# Patient Record
Sex: Female | Born: 1935
Health system: Southern US, Community
[De-identification: ages and names within clinical notes are randomized; demographics above are authoritative.]

---

## 2006-06-09 ENCOUNTER — Ambulatory Visit: Payer: Self-pay | Admitting: Family Medicine

## 2006-07-06 ENCOUNTER — Ambulatory Visit: Payer: Self-pay | Admitting: Family Medicine

## 2007-06-08 ENCOUNTER — Ambulatory Visit: Payer: Self-pay | Admitting: Family Medicine

## 2007-09-18 ENCOUNTER — Ambulatory Visit: Payer: Self-pay | Admitting: General Practice

## 2007-09-18 ENCOUNTER — Other Ambulatory Visit: Payer: Self-pay

## 2007-10-02 ENCOUNTER — Inpatient Hospital Stay: Payer: Self-pay | Admitting: General Practice

## 2007-12-28 ENCOUNTER — Ambulatory Visit: Payer: Self-pay | Admitting: General Practice

## 2008-01-10 ENCOUNTER — Inpatient Hospital Stay: Payer: Self-pay | Admitting: General Practice

## 2009-01-31 IMAGING — CR DG KNEE 1-2V*L*
1 series · 2 of 2 positions shown · non-contrast
Comparison: none

REASON FOR EXAM: postop
COMMENTS:   Bedside (portable):Y

[Series 1: view not recorded · 0.17mm/px · 2 of 2 slices shown]
[im 1/2]
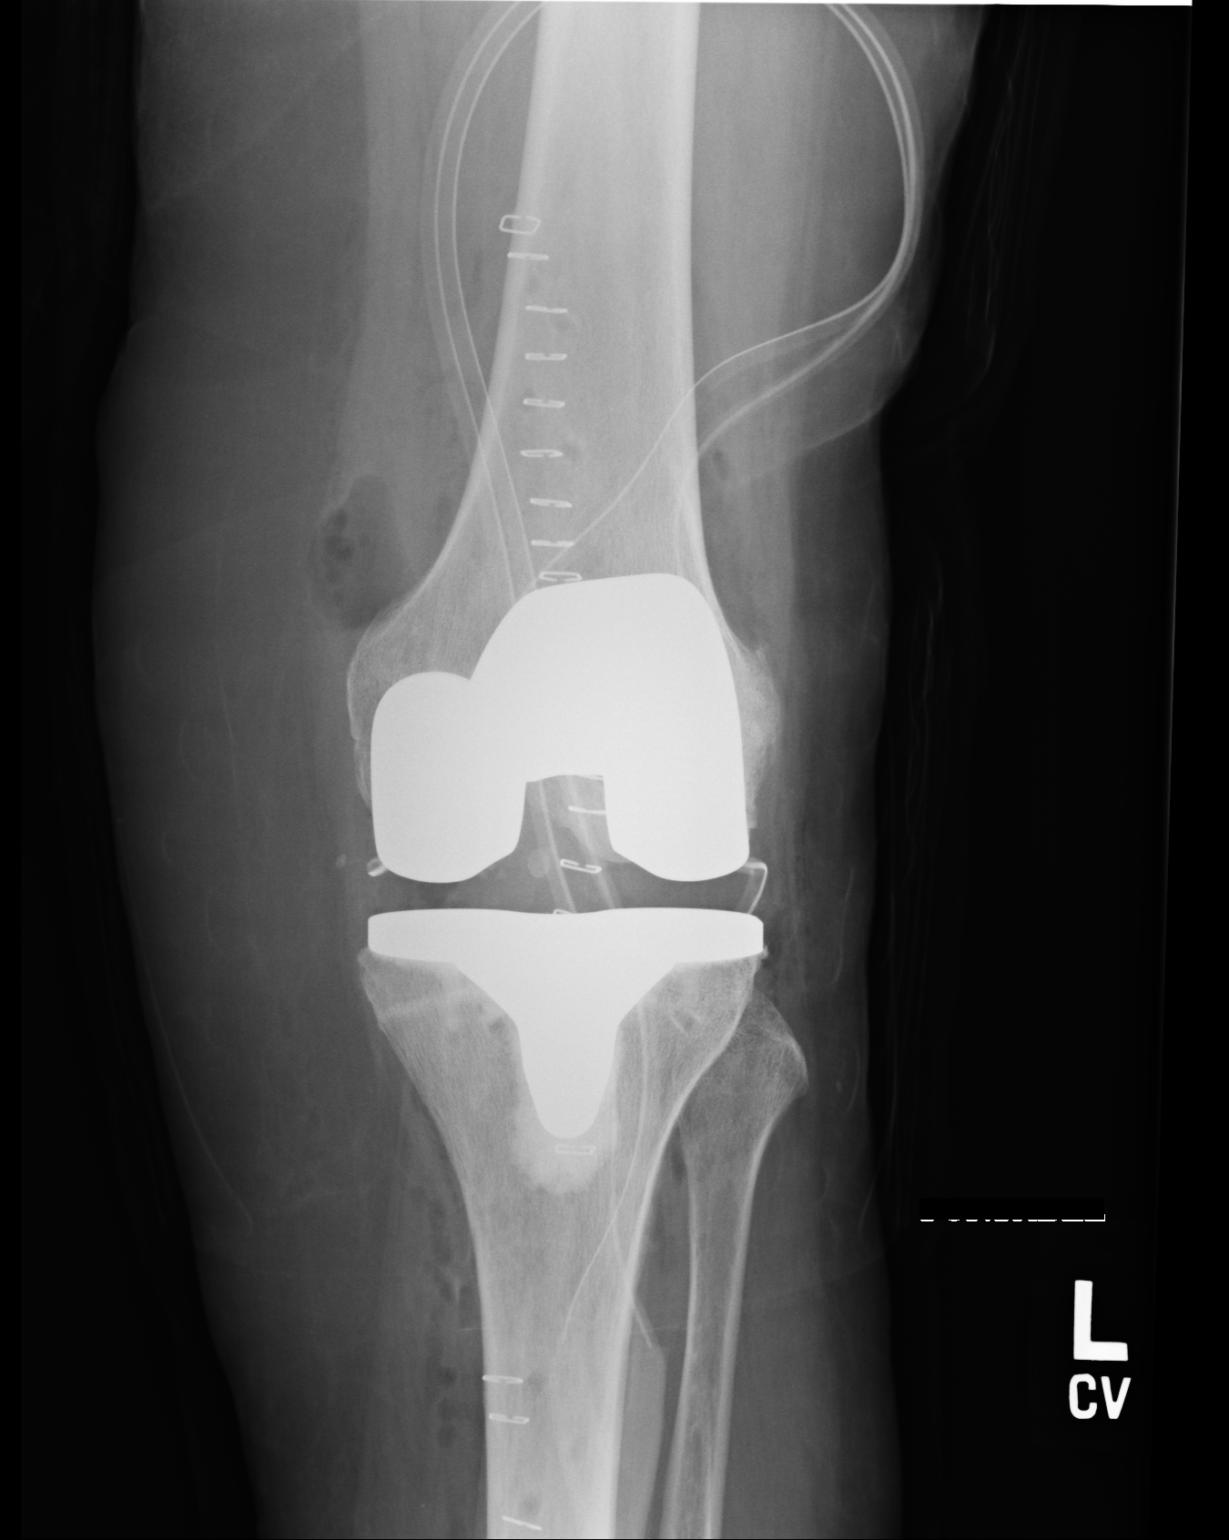
[im 2/2]
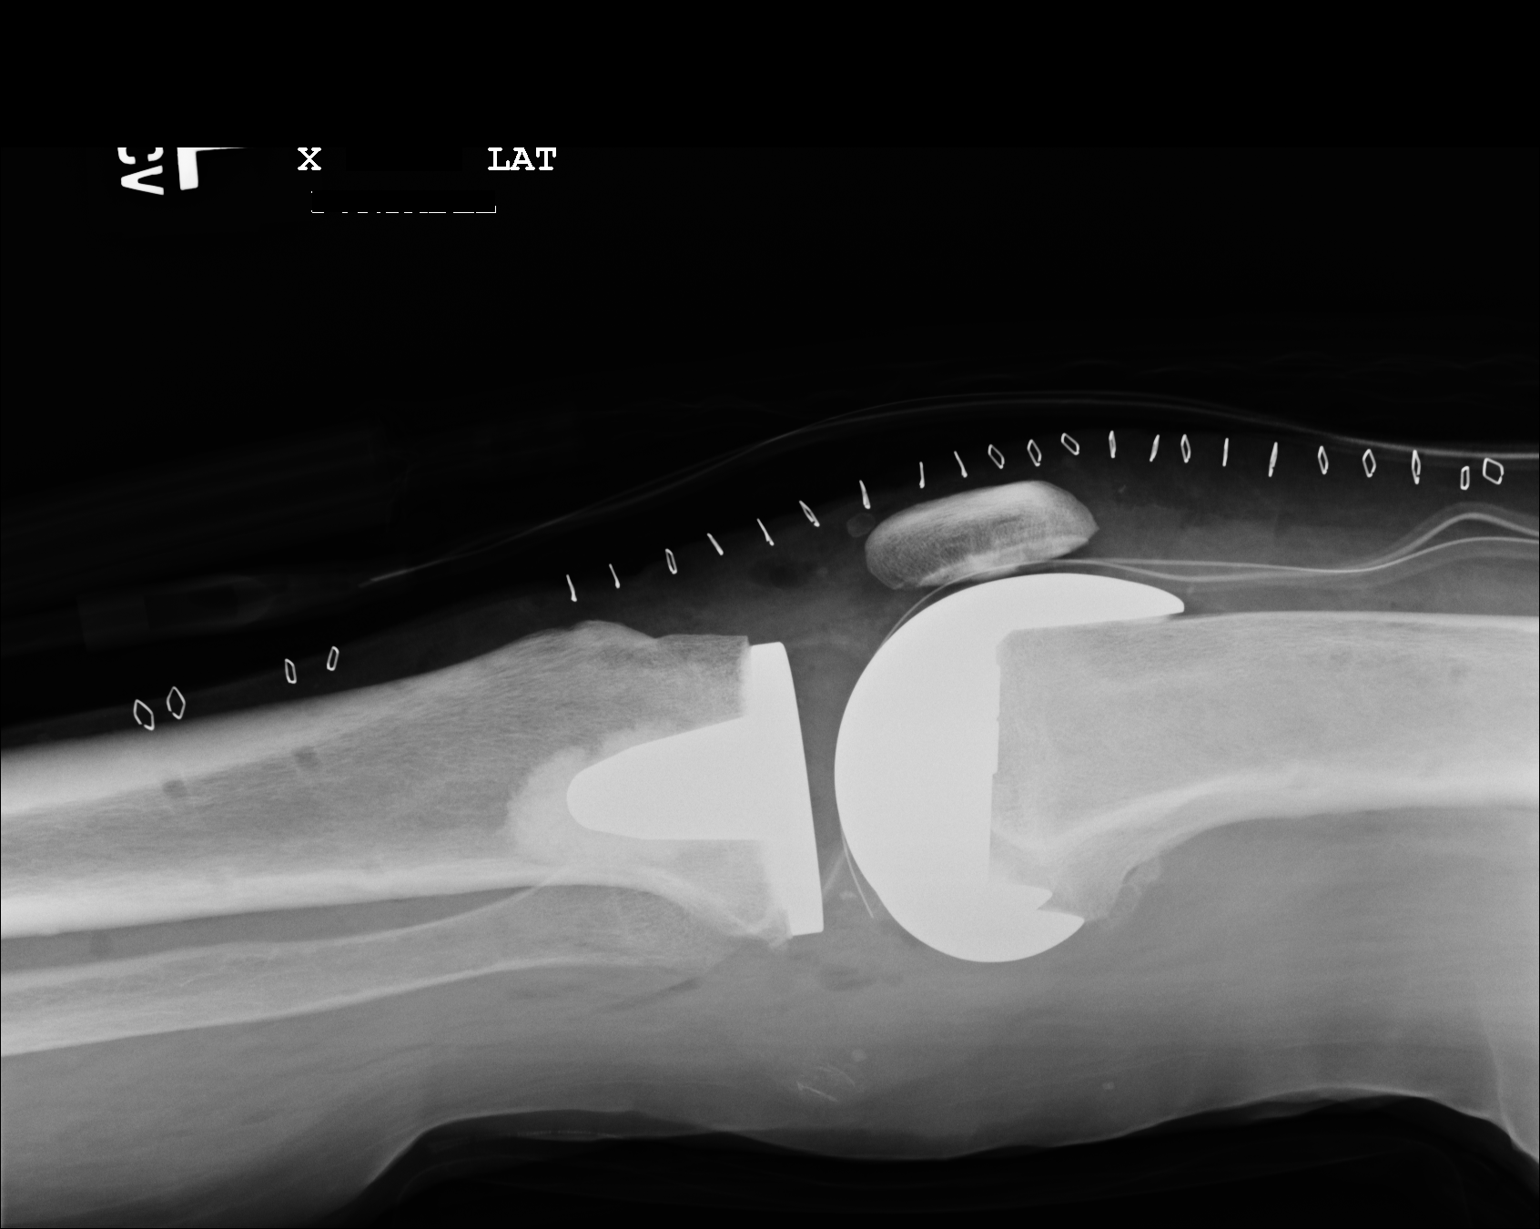

[2 of 2 positions shown; findings below may reference images not displayed]

PROCEDURE:     DXR - DXR KNEE LEFT AP AND LATERAL  - October 02, 2007  [DATE]

RESULT:     The patient is status post total LEFT knee replacement. No
fracture about the femoral or tibial prosthetic components is seen. No
dislocation at the knee joint is observed. Surgical drains are present
anteriorly.
IMPRESSION: 1. The patient is status post LEFT knee replacement.
2. No abnormal postoperative changes are identified.

## 2011-04-19 ENCOUNTER — Ambulatory Visit: Payer: Self-pay | Admitting: Oncology

## 2011-04-20 ENCOUNTER — Ambulatory Visit: Payer: Self-pay | Admitting: Oncology

## 2011-05-04 ENCOUNTER — Ambulatory Visit: Payer: Self-pay | Admitting: Oncology

## 2011-05-07 ENCOUNTER — Ambulatory Visit: Payer: Self-pay | Admitting: Surgery

## 2011-05-07 DIAGNOSIS — I10 Essential (primary) hypertension: Secondary | ICD-10-CM

## 2011-05-13 ENCOUNTER — Ambulatory Visit: Payer: Self-pay | Admitting: Surgery

## 2011-05-14 LAB — CBC CANCER CENTER
Basophil #: 0.2 x10 3/mm — ABNORMAL HIGH (ref 0.0–0.1)
Eosinophil %: 19 %
Eosinophil: 19 %
HCT: 37.4 % (ref 35.0–47.0)
HGB: 12.8 g/dL (ref 12.0–16.0)
Lymphocyte #: 1.5 x10 3/mm (ref 1.0–3.6)
MCH: 34.5 pg — ABNORMAL HIGH (ref 26.0–34.0)
MCHC: 34.2 g/dL (ref 32.0–36.0)
Metamyelocyte: 1 %
Monocyte #: 7 x10 3/mm — ABNORMAL HIGH (ref 0.0–0.7)
Monocyte %: 11.4 %
Monocytes: 15 %
Myelocyte: 2 %
Neutrophil #: 40.8 x10 3/mm — ABNORMAL HIGH (ref 1.4–6.5)
RBC: 3.71 10*6/uL — ABNORMAL LOW (ref 3.80–5.20)
RDW: 12.7 % (ref 11.5–14.5)
WBC: 61.1 x10 3/mm — ABNORMAL HIGH (ref 3.6–11.0)

## 2011-05-14 LAB — COMPREHENSIVE METABOLIC PANEL
Alkaline Phosphatase: 82 U/L (ref 50–136)
Bilirubin,Total: 0.3 mg/dL (ref 0.2–1.0)
Chloride: 97 mmol/L — ABNORMAL LOW (ref 98–107)
Co2: 33 mmol/L — ABNORMAL HIGH (ref 21–32)
EGFR (Non-African Amer.): 50 — ABNORMAL LOW
Osmolality: 277 (ref 275–301)
SGOT(AST): 15 U/L (ref 15–37)
SGPT (ALT): 16 U/L

## 2011-06-04 ENCOUNTER — Ambulatory Visit: Payer: Self-pay | Admitting: Oncology

## 2011-06-04 LAB — PATHOLOGY REPORT

## 2011-07-02 DEATH — deceased

## 2012-08-21 IMAGING — CT CT CHEST-ABD-PELV W/ CM
1 of 3 series · 13 of 32 positions shown, 18 images · IV contrast (isovue)
Comparison: none

REASON FOR EXAM: lymphadenopathy  leukocytosis
COMMENTS:

PROCEDURE:     KCT - KCT CHEST ABDOMEN AND PELVIS W  - April 22, 2011 [DATE]
RESULT:     Comparison: None
TECHNIQUE: Multiple axial images obtained from the thoracic inlet to the
pubic symphysis, with p.o. contrast and with 85 ml of Isovue- 300
intravenous contrast.

[Series 2: ch-ab-pel w 5.0 i40f 3 · axial · 0.74mm/px · z∈[-175,+345]mm · 13 of 118 slices shown, 18 images]
[im 7/118  soft-tissue]
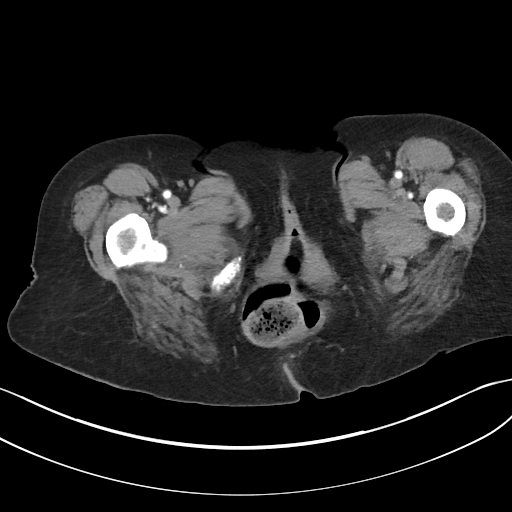
[im 7/118  bone]
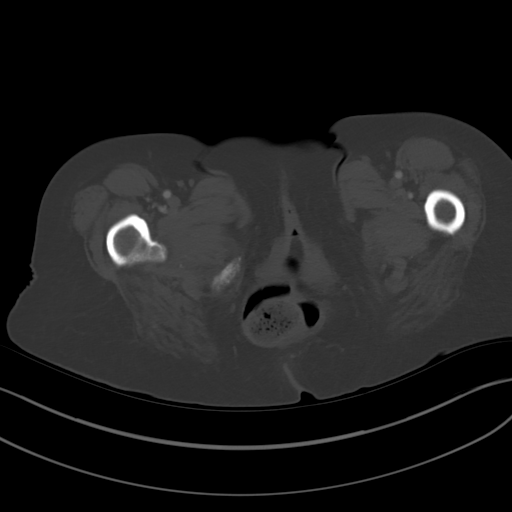
[im 21/118  soft-tissue]
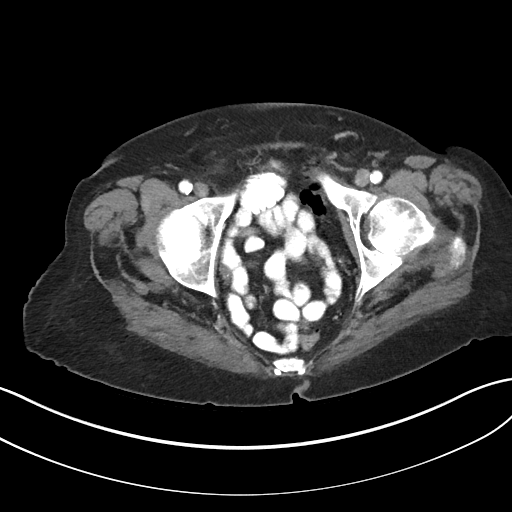
[im 28/118  soft-tissue]
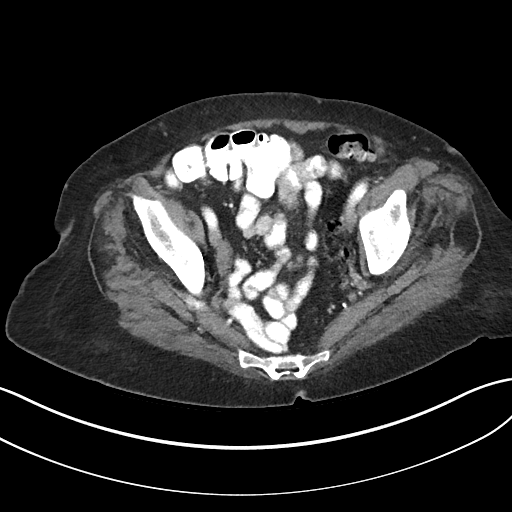
[im 35/118  soft-tissue]
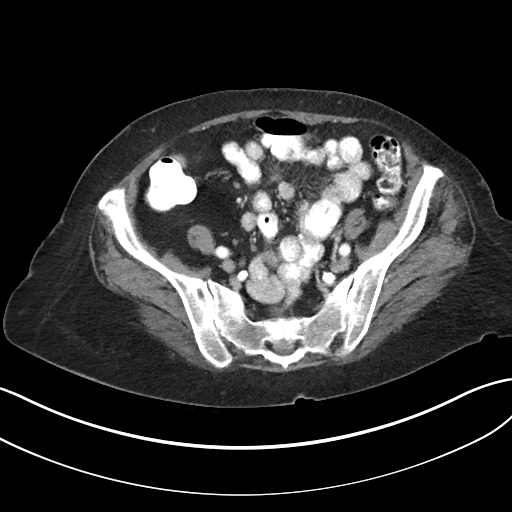
[im 49/118  soft-tissue]
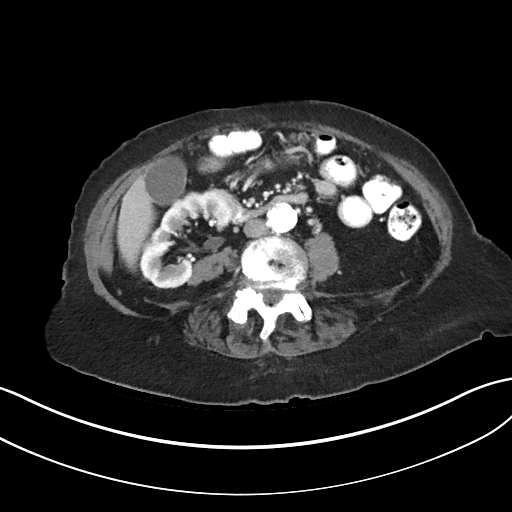
[im 56/118  soft-tissue]
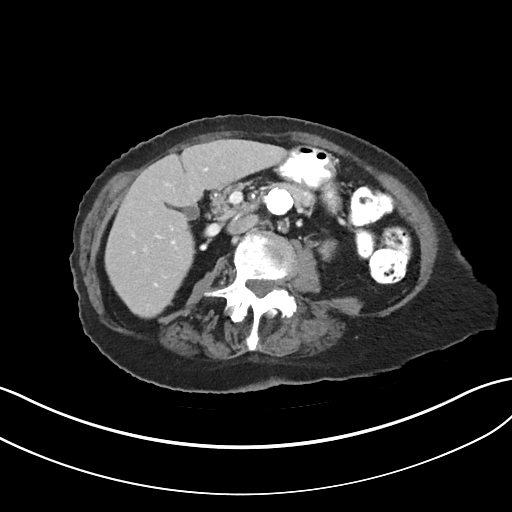
[im 62/118  soft-tissue]
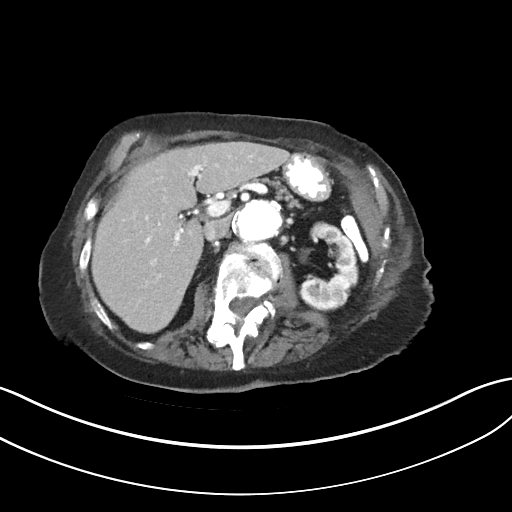
[im 76/118  soft-tissue]
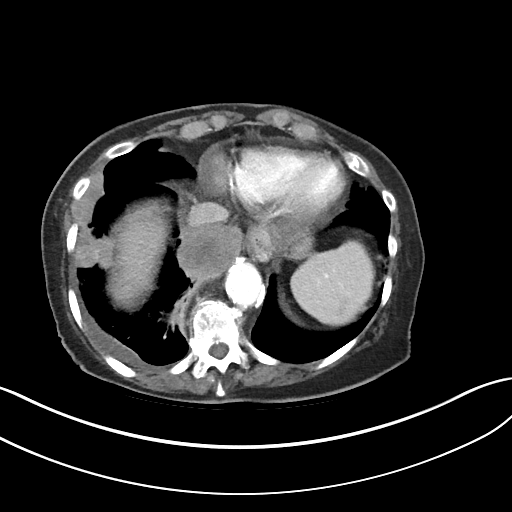
[im 83/118  soft-tissue]
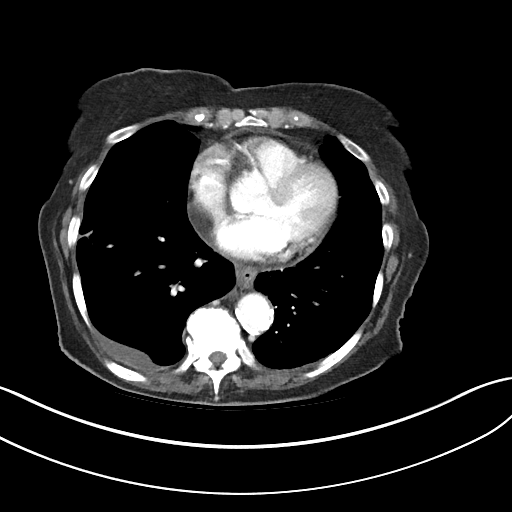
[im 83/118  bone]
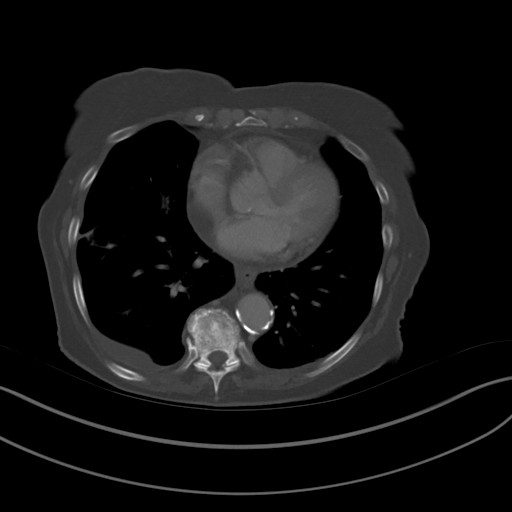
[im 90/118  soft-tissue]
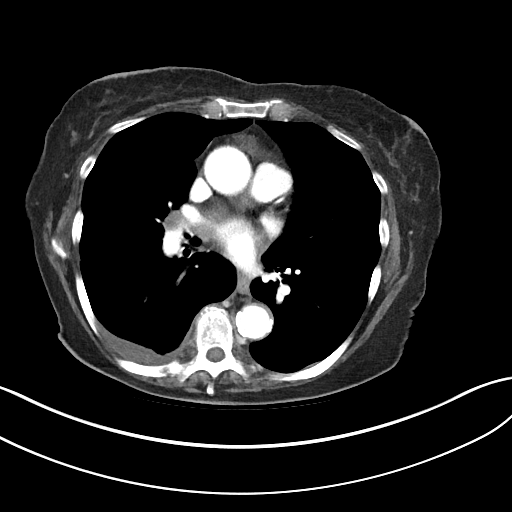
[im 90/118  lung]
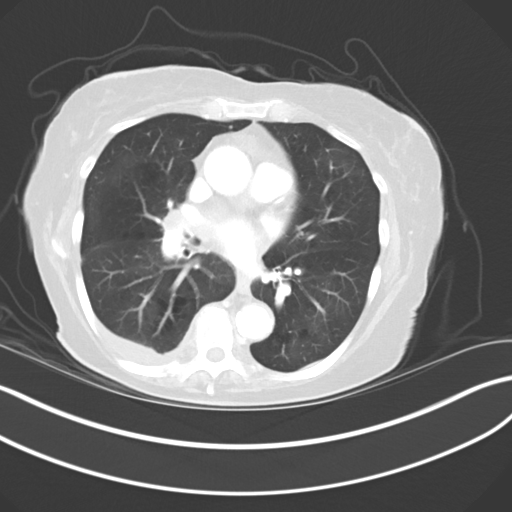
[im 97/118  lung]
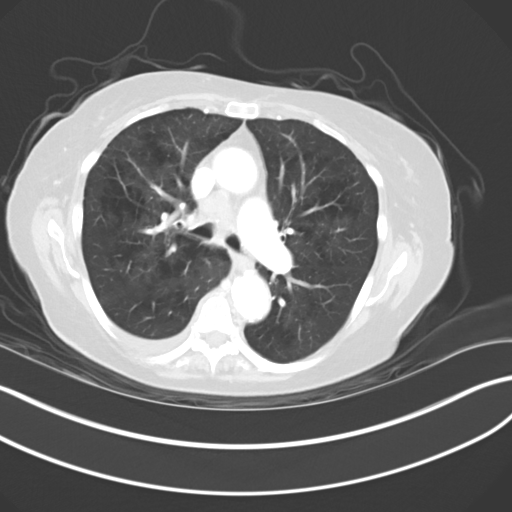
[im 104/118  soft-tissue]
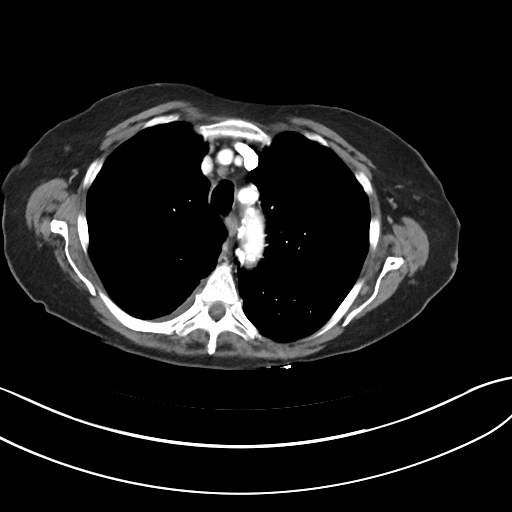
[im 104/118  lung]
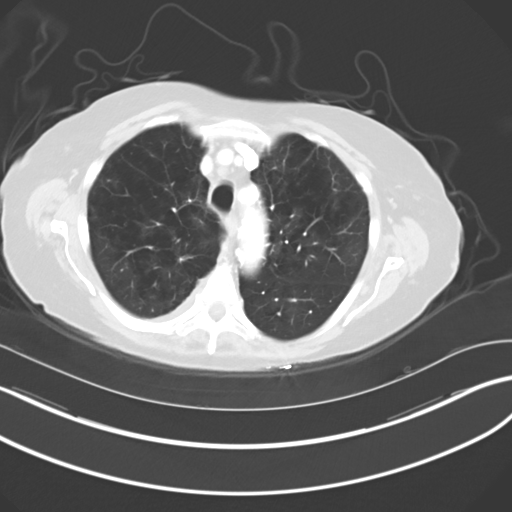
[im 111/118  soft-tissue]
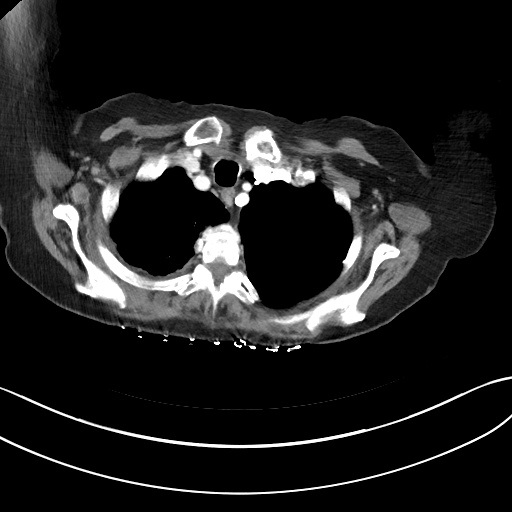
[im 111/118  lung]
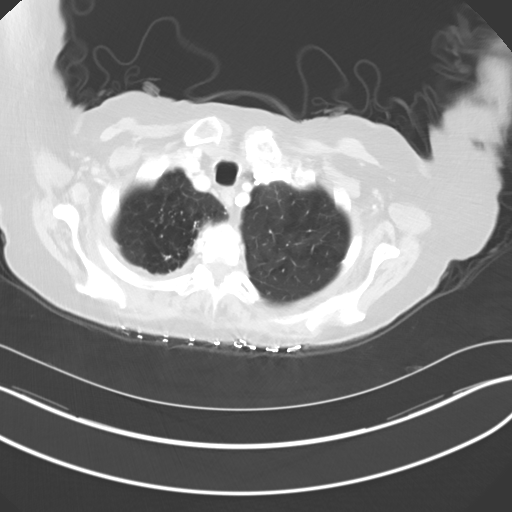

[13 of 32 positions shown; findings below may reference images not displayed]

FINDINGS: There is an enlarged precarinal lymph node which measures 2.2 x 1.9 cm.
There is an enlarged paratracheal lymph node which measures 1.1 cm in
diameter. Subcarinal lymph node is enlarged, measuring 2.8 x 1.5 cm. There
is an enlarged right hilar lymph node measuring 1.5 x 1.0 cm. Calcifications
are seen in the coronary arteries. There are multiple peripheral enhancing
masses in the right lower lung. The largest is seen along the medial right
lower hemithorax, abutting the heart. It measures 4.8 x 3.7 cm. There is a
trace right pleural effusion.

There is moderate centrilobular emphysema. There is a partially calcified
nodular density in the right upper lung which measures 2.1 x 0.9 cm. There
is a 1.3 cm nodular density in the subpleural left lower lobe. There is a
central cavitation associated with this nodule.

The liver, the gallbladder, and adrenals are unremarkable. There is relative
atrophy of the pancreas, with prominence of the pancreatic duct in the body
and tail of the pancreas. There is soft tissue thickening at the
gastroesophageal junction and gastric fundus. There is mild thickening of
the distal esophagus. There is a small exophytic low-attenuation lesion in
the left kidney, which likely represents a cyst. Small low-attenuation
lesions in the kidneys are too small to characterize. There are 2 lobular
foci of low-attenuation in the spleen. The largest measures 1.4 x 1.2 cm.
There is an infrarenal abdominal aortic aneurysm, measuring 3.5 x 3.1 cm in
greatest diameter. The small and large bowel are normal in caliber. The
patient is status post hysterectomy. There is a 2.0 x 1.5 cm mass in the
subcutaneous fat of the anterior left thigh. This is incompletely visualized
on the last image of the scan.

No aggressive lytic or sclerotic osseous lesions are identified.
IMPRESSION: 1. Hyperenhancing lobular masses at the periphery of the right lower lung
are concerning for malignancy/metastatic disease. These may be
pleural-based. Enlarged mediastinal lymph nodes concerning for metastatic
disease. There is an indeterminate partially calcified large nodule in the
right upper lobe and indeterminate cavitated nodule in the left lower lobe.
2. Focal soft tissue thickening at the gastroesophageal junction and gastric
fundus is nonspecific. Malignancy cannot be excluded. Endoscopy is suggested.
3. Indeterminate soft tissue mass in the subcutaneous fat of the left thigh,
incompletely visualized.
4. Indeterminate low-attenuation lesion in the spleen. These may be benign
or represent metastatic disease.
5. Abdominal aortic aneurysm.

## 2012-09-16 IMAGING — CR DG CHEST 2V
1 series · 2 of 2 positions shown · non-contrast
Comparison: none

REASON FOR EXAM: CXR SOB
COMMENTS:

[Series 1: w chest pa · 0.14mm/px · 2 of 2 slices shown]
[im 1/2]
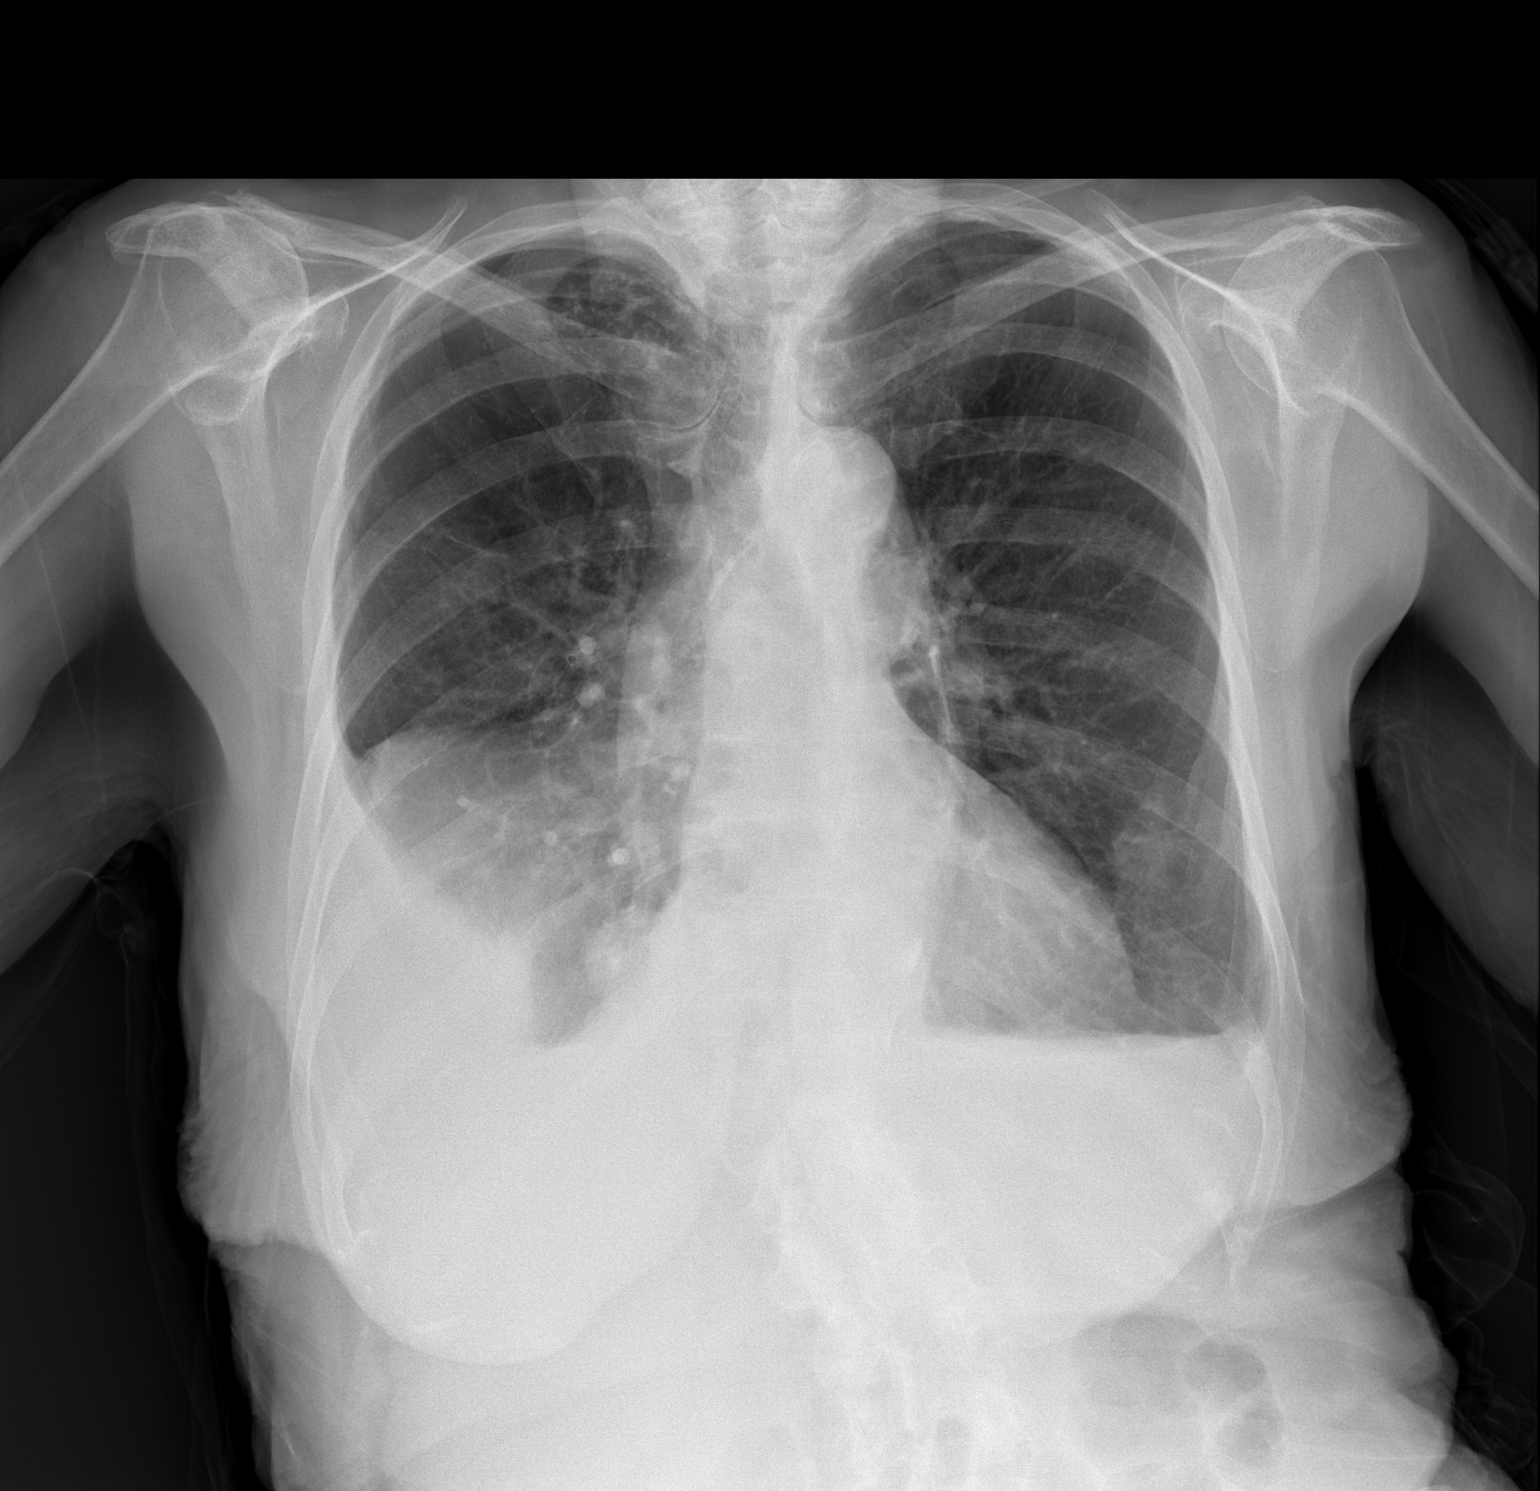
[im 2/2]
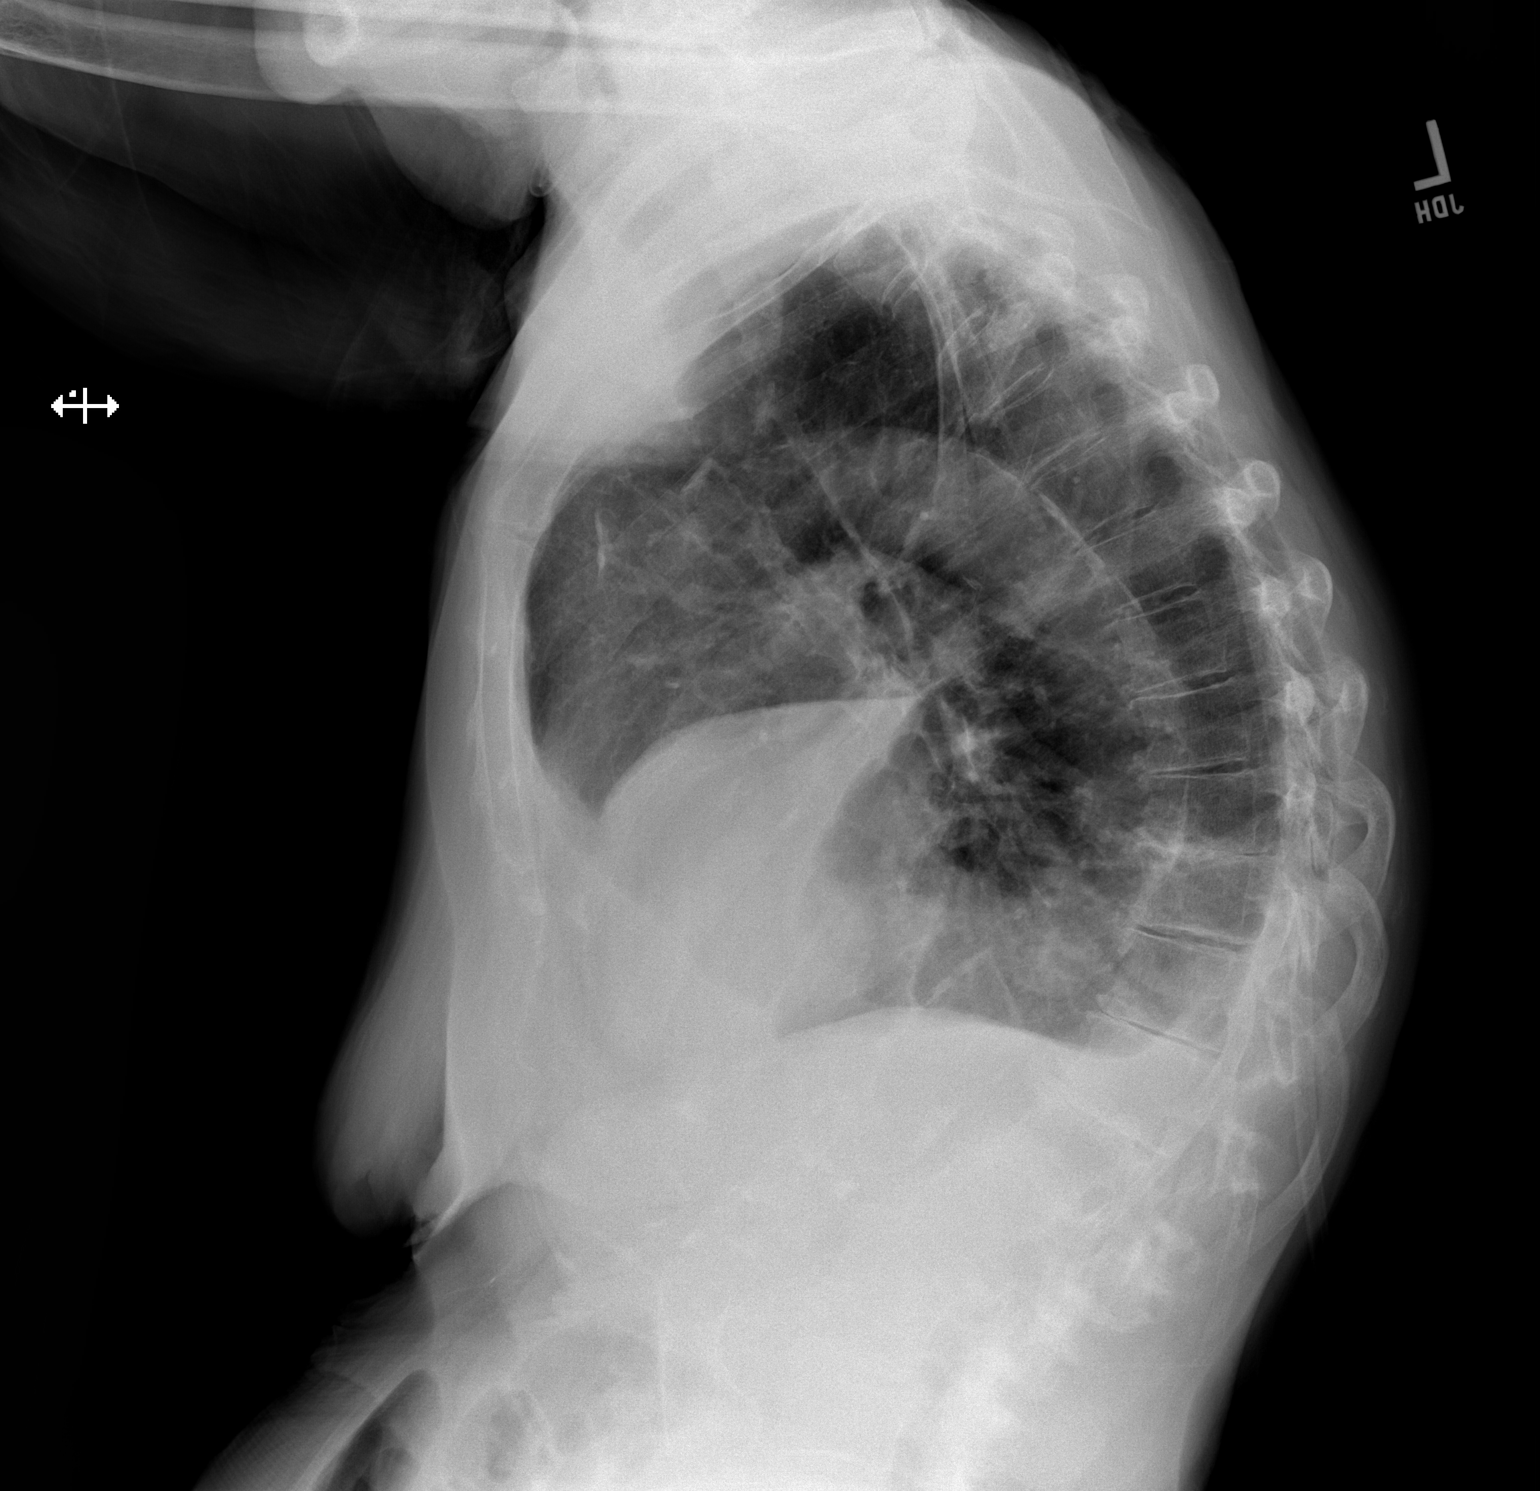

[2 of 2 positions shown; findings below may reference images not displayed]

PROCEDURE:     DXR - DXR CHEST PA (OR AP) AND LATERAL  - May 18, 2011  [DATE]

RESULT:     There is increased density in the right lung base consistent
with right middle lobe pneumonia with some right lower lobe involvement. The
remaining portions of the lungs are clear. Prominent atherosclerotic
calcification is seen within the aorta.
IMPRESSION: Right middle lobe and right lower lobe pneumonia. Followup
to document complete resolution is recommended. The possibility of
postobstructive atelectasis in the right middle lobe is not excluded.

## 2014-08-25 NOTE — Op Note (Signed)
PATIENT NAME:  Elizabeth Cooper, Rox M MR#:  [id number] DATE OF BIRTH:  Sep 03, 1921  PREOPERATIVE DIAGNOSIS: Right cervical lymphadenopathy.   POSTOPERATIVE DIAGNOSIS: Right cervical lymphadenopathy.   PROCEDURE PERFORMED: Right cervical lymph node biopsy.   SURGEON: Quentin Orealph L. Ely, M.D.   ANESTHESIA: Monitored anesthetic care.  OPERATIVE PROCEDURE: With the patient in the supine position after induction of appropriate intravenous sedation, the patient's right neck was prepped with ChloraPrep and draped with sterile towels. 1% Xylocaine buffered with sodium bicarbonate was injected over the palpable lesion, which had previously been marked. Incision was carried down through the subcutaneous tissue and platysmas using Bovie electrocautery. The lesion was identified, grasped with an Allis clamp, and carefully dissected free from the surrounding tissue. Specimen was sent for permanent section. The area was irrigated and hemostasis appeared to be satisfactory. Subcutaneous space was obliterated with 3-0 Vicryl and the skin was closed with a running subcuticular suture of 4-0 Vicryl. Sterile dressings were applied. The patient was returned to the recovery room having tolerated the procedure well. Sponge, instrument, and needle counts were correct x2 in the Operating Room.   ____________________________ Quentin Orealph L. Ely III, MD rle:rbg D: 05/13/2011 08:02:38 ET T: 05/13/2011 09:13:36 ET JOB#: [id number]  cc: Quentin Orealph L. Ely III, MD, <Dictator> Gerome SamJanak K. Doylene Canninghoksi, MD Jorje GuildGlenn R. Beckey DowningWillett, MD Quentin OreALPH L ELY MD ELECTRONICALLY SIGNED 05/13/2011 20:13
